# Patient Record
Sex: Female | Born: 1990 | Race: White | Hispanic: No | Marital: Single | Smoking: Never smoker
Health system: Southern US, Community
[De-identification: ages and names within clinical notes are randomized; demographics above are authoritative.]

## PROBLEM LIST (undated history)

## (undated) ENCOUNTER — Inpatient Hospital Stay (HOSPITAL_COMMUNITY): Payer: Self-pay

## (undated) DIAGNOSIS — J45909 Unspecified asthma, uncomplicated: Secondary | ICD-10-CM

## (undated) DIAGNOSIS — J309 Allergic rhinitis, unspecified: Secondary | ICD-10-CM

## (undated) HISTORY — DX: Unspecified asthma, uncomplicated: J45.909

## (undated) HISTORY — DX: Allergic rhinitis, unspecified: J30.9

---
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
## 2010-05-23 ENCOUNTER — Inpatient Hospital Stay (HOSPITAL_COMMUNITY)
Admission: RE | Admit: 2010-05-23 | Discharge: 2010-05-25 | Payer: Self-pay | Source: Home / Self Care | Admitting: Psychiatry

## 2010-05-23 ENCOUNTER — Ambulatory Visit: Payer: Self-pay | Admitting: Psychiatry

## 2010-10-03 LAB — COMPREHENSIVE METABOLIC PANEL
ALT: 25 U/L (ref 0–35)
AST: 26 U/L (ref 0–37)
Albumin: 4.2 g/dL (ref 3.5–5.2)
Alkaline Phosphatase: 77 U/L (ref 39–117)
BUN: 10 mg/dL (ref 6–23)
CO2: 25 mEq/L (ref 19–32)
Calcium: 9.7 mg/dL (ref 8.4–10.5)
Chloride: 101 mEq/L (ref 96–112)
Creatinine, Ser: 0.76 mg/dL (ref 0.4–1.2)
GFR calc Af Amer: >60 mL/min (ref >60)
GFR calc non Af Amer: >60 mL/min (ref >60)
Glucose, Bld: 102 mg/dL — ABNORMAL HIGH (ref 70–99)
Potassium: 4 mEq/L (ref 3.5–5.1)
Sodium: 137 mEq/L (ref 135–145)
Total Bilirubin: 0.5 mg/dL (ref 0.3–1.2)
Total Protein: 7.3 g/dL (ref 6.0–8.3)

## 2010-10-03 LAB — CBC
HCT: 39.1 % (ref 36.0–46.0)
Hemoglobin: 13.3 g/dL (ref 12.0–15.0)
MCH: 30.5 pg (ref 26.0–34.0)
MCHC: 33.9 g/dL (ref 30.0–36.0)
MCV: 90.1 fL (ref 78.0–100.0)
Platelets: 284 10*3/uL (ref 150–400)
RBC: 4.34 MIL/uL (ref 3.87–5.11)
RDW: 13.1 % (ref 11.5–15.5)
WBC: 6.3 10*3/uL (ref 4.0–10.5)

## 2010-10-03 LAB — TSH: TSH: 2.44 u[IU]/mL (ref 0.350–4.500)

## 2013-11-18 ENCOUNTER — Ambulatory Visit: Payer: Self-pay | Admitting: Cardiovascular Disease

## 2013-11-18 ENCOUNTER — Encounter: Payer: Self-pay | Admitting: *Deleted

## 2013-11-18 ENCOUNTER — Encounter: Payer: Self-pay | Admitting: Cardiovascular Disease

## 2013-11-24 ENCOUNTER — Encounter: Payer: Self-pay | Admitting: Cardiovascular Disease

## 2014-09-01 ENCOUNTER — Ambulatory Visit (INDEPENDENT_AMBULATORY_CARE_PROVIDER_SITE_OTHER): Payer: 59 | Admitting: Physician Assistant

## 2014-09-01 VITALS — BP 98/60 | HR 73 | Temp 98.6°F | Resp 16 | Ht 66.5 in | Wt 146.8 lb

## 2014-09-01 DIAGNOSIS — M5442 Lumbago with sciatica, left side: Secondary | ICD-10-CM

## 2014-09-01 DIAGNOSIS — M5432 Sciatica, left side: Secondary | ICD-10-CM | POA: Insufficient documentation

## 2014-09-01 NOTE — Patient Instructions (Signed)
[facility].  Please feel free to see us again for any other issues you may have.

## 2014-09-01 NOTE — Progress Notes (Signed)
   Subjective:    Patient ID: Melody Rivera, female    DOB: 07/14/1991, 24 y.o.   MRN: [id number]  Chief Complaint  Patient presents with  . Leg Pain  . Back Pain   Patient Active Problem List   Diagnosis Date Noted  . Sciatica of left side 09/01/2014   Medications, allergies, past medical history, surgical history, family history, social history and problem list reviewed and updated.  HPI  [age] yof with pmh low back pain, sciatica presents for ortho referral.   Has had left sided low back pain chronically for one year. No particular injury. 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   Three days ago she had an episode where she suddenly couldn't move her legs when walking to the bathroom. The feeling eventually came back to right leg but has continued to have lle weakness, tingling, and numbness. She went to a diff UC 3 days ago and they gave her pain meds and referred her to ortho.   She saw ortho earlier today and they want to order an mri. She needs referral from her pcp to the ortho group for insurance purposes. Dr Patsy Lageropland will be her new pcp. She is here today for the referral.   Review of Systems No bowel/bladder dysfx. No perianal los.     Objective:   Physical Exam  Constitutional: She is oriented to person, place, and time. She appears well-developed and well-nourished.  Non-toxic appearance. She does not have a sickly appearance. She does not appear ill. No distress.  BP 98/60 mmHg  Pulse 73  Temp(Src) 98.6 F (37 C) (Oral)  Resp 16  Ht 5' 6.5" (1.689 m)  Wt 146 lb 12.8 oz (66.588 kg)  BMI 23.34 kg/m2  SpO2 97%  LMP 08/28/2014   Neurological: She is alert and oriented to person, place, and time.  Psychiatric: She has a normal mood and affect. Her speech is normal and behavior is normal.      Assessment & Plan:   89 yof with pmh low back pain, sciatica presents for ortho referral.    Sciatica, left - Plan: Ambulatory referral to Orthopedic Surgery Left-sided low back pain with left-sided sciatica - Plan: Ambulatory referral to Orthopedic Surgery --no testing done in clinic today as pt just left ortho appt for same condition and had workup there --referral to ortho sent urgent so they can schedule her for mri --rtc if she needs anything in intermediate  Donnajean Lopesodd M. Nial Hawe, PA-C Physician Assistant-Certified [facility]  09/01/2014 7:03 PM

## 2015-07-04 ENCOUNTER — Encounter: Payer: Self-pay | Admitting: Family Medicine

## 2015-07-04 ENCOUNTER — Ambulatory Visit (INDEPENDENT_AMBULATORY_CARE_PROVIDER_SITE_OTHER): Payer: 59

## 2015-07-04 ENCOUNTER — Ambulatory Visit (INDEPENDENT_AMBULATORY_CARE_PROVIDER_SITE_OTHER): Payer: 59 | Admitting: Family Medicine

## 2015-07-04 VITALS — BP 118/70 | HR 63 | Temp 98.3°F | Resp 16 | Ht 66.0 in | Wt 162.0 lb

## 2015-07-04 DIAGNOSIS — R002 Palpitations: Secondary | ICD-10-CM | POA: Diagnosis not present

## 2015-07-04 LAB — COMPREHENSIVE METABOLIC PANEL
ALT: 33 U/L — ABNORMAL HIGH (ref 6–29)
AST: 27 U/L (ref 10–30)
Albumin: 4.5 g/dL (ref 3.6–5.1)
Alkaline Phosphatase: 38 U/L (ref 33–115)
BUN: 12 mg/dL (ref 7–25)
CALCIUM: 9.7 mg/dL (ref 8.6–10.2)
CO2: 25 mmol/L (ref 20–31)
Chloride: 103 mmol/L (ref 98–110)
Creat: 0.63 mg/dL (ref 0.50–1.10)
Glucose, Bld: 84 mg/dL (ref 65–99)
Potassium: 4 mmol/L (ref 3.5–5.3)
Sodium: 139 mmol/L (ref 135–146)
Total Bilirubin: 0.4 mg/dL (ref 0.2–1.2)
Total Protein: 7.2 g/dL (ref 6.1–8.1)

## 2015-07-04 LAB — CBC
HEMATOCRIT: 39 % (ref 36.0–46.0)
Hemoglobin: 13.2 g/dL (ref 12.0–15.0)
MCH: 30.6 pg (ref 26.0–34.0)
MCHC: 33.8 g/dL (ref 30.0–36.0)
MCV: 90.3 fL (ref 78.0–100.0)
MPV: 9.6 fL (ref 8.6–12.4)
Platelets: 282 10*3/uL (ref 150–400)
RBC: 4.32 MIL/uL (ref 3.87–5.11)
RDW: 11.9 % (ref 11.5–15.5)
WBC: 4.9 10*3/uL (ref 4.0–10.5)

## 2015-07-04 LAB — TSH: TSH: 2.156 u[IU]/mL (ref 0.350–4.500)

## 2015-07-04 NOTE — Progress Notes (Signed)
[facility] [address], [place] [id number] [phone]  Date:  07/04/2015   Name:  Melody Rivera   DOB:  [birth year]   MRN:  [id number]  PCP:  No PCP Per Patient    Chief Complaint: Shortness of Breath   History of Present Illness:  Melody Rivera is a 24 y.o. very pleasant female patient who presents with the following:  Established pt with history of asthma and allergies- here today with feelings of chest tightness, heart palp over the last few days.   She has noted sx for about 5 days.   She did have asthma but this has not bothered her for a long time- years.   Today is Monday- Thursday while at rest she felt that her chest was constricted, "couldn't breathe, tried to get some air by yawning.  My face felt very hot. Friday I had some chest congestion and I wanted to cough." then Saturday and Sunday she noted heart palpitations.   Her breathing is now back to normal.  She has palpitations that can last 3-4 minutes- not painful, no CP.  SOB is now gone She has had occasional palpitations in the past but never this persistent.   No history of syncope or CV concerns while exercising.   No decongestants of sudafed used, she has not used an inhaler (in "decades").  She has not increased her caffeine intake- no energy supplements or diet pills.    No immediate family members with heart issues- her GGF did die from his heart in his 4150s.   Never a smoker LMP was last week   Patient Active Problem List   Diagnosis Date Noted  . Sciatica of left side 09/01/2014    Past Medical History  Diagnosis Date  . ASTHMA 10/28/2006    Qualifier: Diagnosis of  By: Cato MulliganSwords MD, Bruce    . ALLERGIC RHINITIS 10/28/2006    Qualifier: Diagnosis of  By: Cato MulliganSwords MD, Bruce      History reviewed. No pertinent past surgical history.  Social History  Substance Use Topics  . Smoking status: Never Smoker   . Smokeless tobacco: None  . Alcohol Use: 0.6 - 1.8 oz/week    1-3  Standard drinks or equivalent per week    Family History  Problem Relation Age of Onset  . Cancer Paternal Grandmother     Allergies  Allergen Reactions  . Azithromycin Swelling and Rash    Swelling of tongue, rash on legs    Medication list has been reviewed and updated.  Current Outpatient Prescriptions on File Prior to Visit  Medication Sig Dispense Refill  . HYDROcodone-acetaminophen (NORCO) 10-325 MG per tablet Take 1 tablet by mouth every 6 (six) hours as needed.     No current facility-administered medications on file prior to visit.    Review of Systems:  As per HPI- otherwise negative.   Physical Examination: Filed Vitals:   07/04/15 0936  BP: 118/70  Pulse: 63  Temp: 98.3 F (36.8 C)  Resp: 16   Filed Vitals:   07/04/15 0936  Height: 5\' 6"  (1.676 m)  Weight: 162 lb (73.483 kg)   Body mass index is 26.16 kg/(m^2). Ideal Body Weight: Weight in (lb) to have BMI = 25: 154.6  GEN: WDWN, NAD, Non-toxic, A & O x 3, looks well HEENT: Atraumatic, Normocephalic. Neck supple. No masses, No LAD.  Bilateral TM wnl, oropharynx normal.  PEERL,EOMI.   Ears and Nose: No external deformity. CV: RRR,  No M/G/R. No JVD. No thrill. No extra heart sounds. PULM: CTA B, no wheezes, crackles, rhonchi. No retractions. No resp. distress. No accessory muscle use. EXTR: No c/c/e NEURO Normal gait.  PSYCH: Normally interactive. Conversant. Not depressed or anxious appearing.  Calm demeanor.   EKG: occasional PACs noted on her EKG and rhythm strip.   [facility] reading (PRIMARY) by  Dr. Patsy Lager. CXR: negative  CHEST 2 VIEW  COMPARISON: 05/31/2004  FINDINGS: The heart size and mediastinal contours are within normal limits. Both lungs are clear. The visualized skeletal structures are unremarkable.  IMPRESSION: No active cardiopulmonary disease.  Assessment and Plan: Palpitations - Plan: EKG 12-Lead, CBC, Comprehensive metabolic panel, TSH, DG Chest 2 View  Here today to  discuss SOB and heart palpitations- the SOB has been resolved for a couple of days, the heart palpitations occurred over the last 48 hours but are not present now.  No CP.  Suspect that she is having PACs/ PVCs causing the heart palpitations. Her EKG and CXR are reassuring, await labs.  She will let me know if sx come back or persist in which case I will refer her to cardiology. She feels comfortable with this plan.   Signed Abbe Amsterdam, MD

## 2015-07-04 NOTE — Patient Instructions (Signed)
Your EKG showed occasional PACs but nothing of acute concern. If you continue to have the palpitations and would like to see cardiology just let me know.  Otherwise I will be in touch with the rest of your labs asap

## 2017-04-28 IMAGING — CR DG CHEST 2V
2 series · 2 of 2 positions shown · non-contrast
Comparison: 05/31/2004

CLINICAL DATA: Shortness of breath. Central chest pressure for 3
days.

EXAM:
CHEST  2 VIEW

[PA]
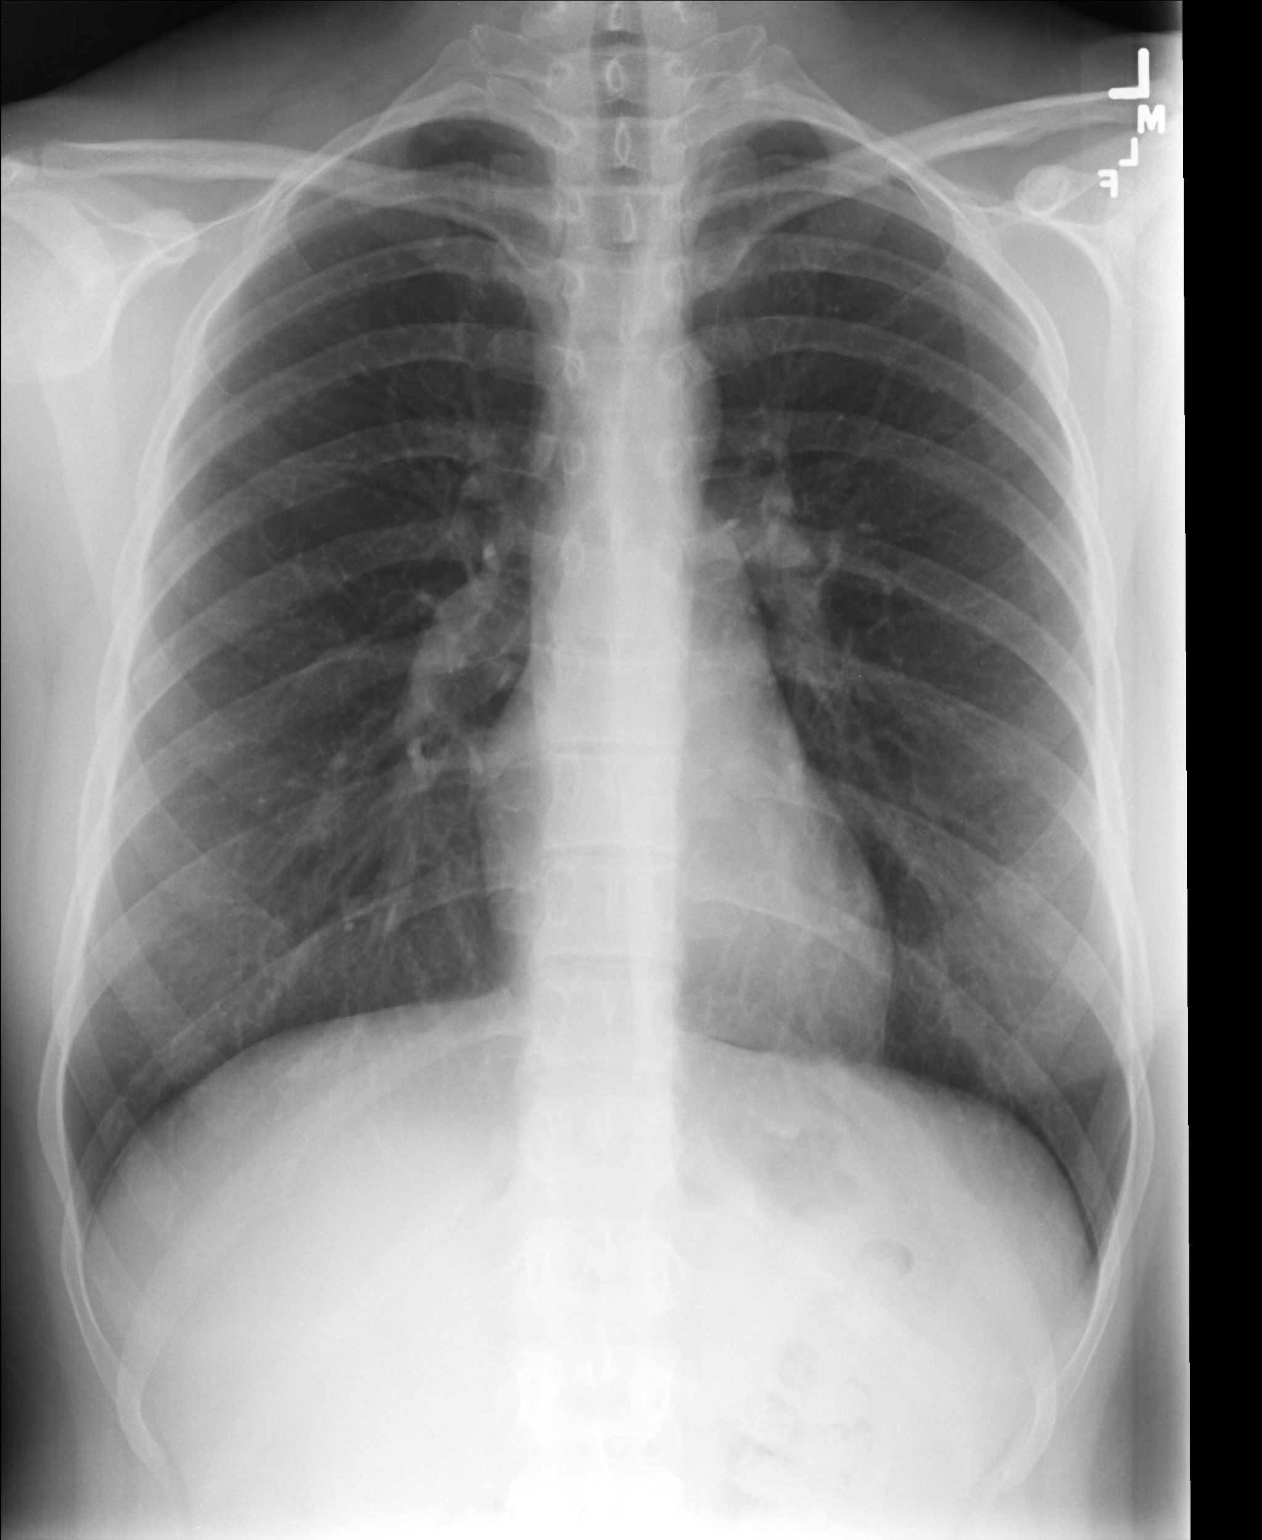

[lateral]
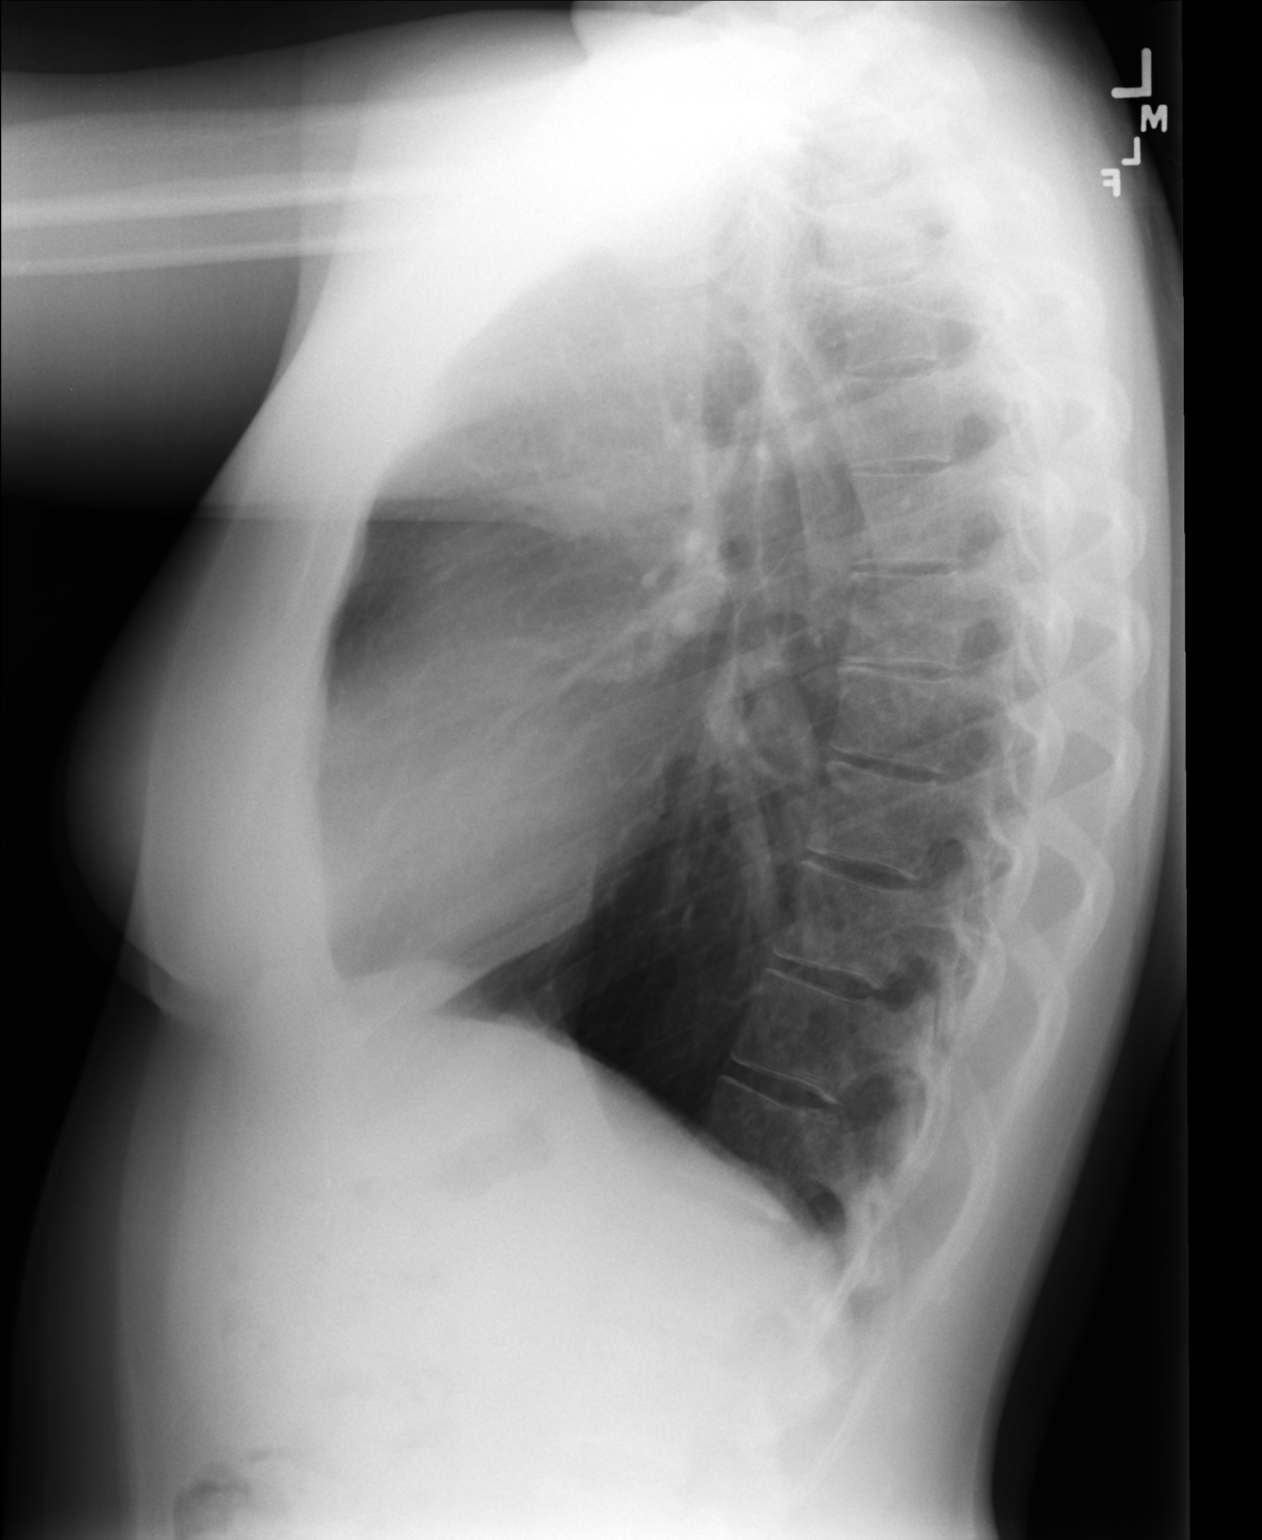

[2 of 2 positions shown; findings below may reference images not displayed]

FINDINGS: The heart size and mediastinal contours are within normal limits.
Both lungs are clear. The visualized skeletal structures are
unremarkable.
IMPRESSION: No active cardiopulmonary disease.

## 2019-06-17 LAB — NOVEL CORONAVIRUS, NAA: SARS-CoV-2, NAA: NOT DETECTED

## 2019-06-19 ENCOUNTER — Other Ambulatory Visit: Payer: Self-pay

## 2019-06-19 DIAGNOSIS — Z20822 Contact with and (suspected) exposure to covid-19: Secondary | ICD-10-CM

## 2019-09-14 ENCOUNTER — Ambulatory Visit: Payer: Self-pay | Attending: Family

## 2019-09-14 DIAGNOSIS — Z23 Encounter for immunization: Secondary | ICD-10-CM | POA: Insufficient documentation

## 2019-09-14 NOTE — Progress Notes (Signed)
   Covid-19 Vaccination Clinic  Name:  Melody Rivera    MRN: [id number] DOB: 06/26/1991  09/14/2019  [name] was observed post Covid-19 immunization for 15 minutes without incidence. She was provided with Vaccine Information Sheet and instruction to access the V-Safe system.   [name] was instructed to call 911 with any severe reactions post vaccine: Marland Kitchen Difficulty breathing  . Swelling of your face and throat  . A fast heartbeat  . A bad rash all over your body  . Dizziness and weakness    Immunizations Administered    Name Date Dose VIS Date Route   Moderna COVID-19 Vaccine 09/14/2019 12:51 PM 0.5 mL 06/23/2019 Intramuscular   Manufacturer: Moderna   Lot: [id number]   NDC: 08676-195-09

## 2019-10-20 ENCOUNTER — Ambulatory Visit: Payer: Self-pay | Attending: Internal Medicine

## 2019-10-20 DIAGNOSIS — Z23 Encounter for immunization: Secondary | ICD-10-CM

## 2019-10-20 NOTE — Progress Notes (Signed)
   Covid-19 Vaccination Clinic  Name:  Melody Rivera    MRN: [id number] DOB: Dec 30, 1990  10/20/2019  Melody Rivera was observed post Covid-19 immunization for 15 minutes without incident. She was provided with Vaccine Information Sheet and instruction to access the V-Safe system.   Melody Rivera was instructed to call 911 with any severe reactions post vaccine: Marland Kitchen Difficulty breathing  . Swelling of face and throat  . A fast heartbeat  . A bad rash all over body  . Dizziness and weakness   Immunizations Administered    Name Date Dose VIS Date Route   Moderna COVID-19 Vaccine 10/20/2019 12:04 PM 0.5 mL 06/23/2019 Intramuscular   Manufacturer: Moderna   Lot: [id number]   NDC: 38182-993-71

## 2021-07-21 ENCOUNTER — Other Ambulatory Visit: Payer: Self-pay

## 2021-07-21 ENCOUNTER — Emergency Department (HOSPITAL_BASED_OUTPATIENT_CLINIC_OR_DEPARTMENT_OTHER): Payer: 59 | Admitting: Radiology

## 2021-07-21 ENCOUNTER — Encounter (HOSPITAL_BASED_OUTPATIENT_CLINIC_OR_DEPARTMENT_OTHER): Payer: Self-pay | Admitting: Emergency Medicine

## 2021-07-21 ENCOUNTER — Emergency Department (HOSPITAL_BASED_OUTPATIENT_CLINIC_OR_DEPARTMENT_OTHER)
Admission: EM | Admit: 2021-07-21 | Discharge: 2021-07-21 | Disposition: A | Discharge status: Home / Self Care | Payer: 59 | Attending: Emergency Medicine | Admitting: Emergency Medicine

## 2021-07-21 DIAGNOSIS — R0789 Other chest pain: Secondary | ICD-10-CM | POA: Insufficient documentation

## 2021-07-21 DIAGNOSIS — Z20822 Contact with and (suspected) exposure to covid-19: Secondary | ICD-10-CM | POA: Diagnosis not present

## 2021-07-21 DIAGNOSIS — R059 Cough, unspecified: Secondary | ICD-10-CM | POA: Diagnosis not present

## 2021-07-21 DIAGNOSIS — R21 Rash and other nonspecific skin eruption: Secondary | ICD-10-CM | POA: Diagnosis not present

## 2021-07-21 DIAGNOSIS — R0981 Nasal congestion: Secondary | ICD-10-CM | POA: Insufficient documentation

## 2021-07-21 DIAGNOSIS — J45909 Unspecified asthma, uncomplicated: Secondary | ICD-10-CM | POA: Diagnosis not present

## 2021-07-21 LAB — CBC WITH DIFFERENTIAL/PLATELET
Abs Immature Granulocytes: 0.02 10*3/uL (ref 0.00–0.07)
Basophils Absolute: 0 10*3/uL (ref 0.0–0.1)
Basophils Relative: 1 %
Eosinophils Absolute: 0.1 10*3/uL (ref 0.0–0.5)
Eosinophils Relative: 2 %
HCT: 39.1 % (ref 36.0–46.0)
Hemoglobin: 13 g/dL (ref 12.0–15.0)
Immature Granulocytes: 0 %
Lymphocytes Relative: 35 %
Lymphs Abs: 1.9 10*3/uL (ref 0.7–4.0)
MCH: 30.9 pg (ref 26.0–34.0)
MCHC: 33.2 g/dL (ref 30.0–36.0)
MCV: 92.9 fL (ref 80.0–100.0)
Monocytes Absolute: 0.4 10*3/uL (ref 0.1–1.0)
Monocytes Relative: 7 %
Neutro Abs: 3 10*3/uL (ref 1.7–7.7)
Neutrophils Relative %: 55 %
Platelets: 266 10*3/uL (ref 150–400)
RBC: 4.21 MIL/uL (ref 3.87–5.11)
RDW: 11.8 % (ref 11.5–15.5)
WBC: 5.4 10*3/uL (ref 4.0–10.5)
nRBC: 0 % (ref 0.0–0.2)

## 2021-07-21 LAB — RESP PANEL BY RT-PCR (FLU A&B, COVID) ARPGX2
Influenza A by PCR: NEGATIVE
Influenza B by PCR: NEGATIVE
SARS Coronavirus 2 by RT PCR: NEGATIVE

## 2021-07-21 LAB — BASIC METABOLIC PANEL
Anion gap: 9 (ref 5–15)
BUN: 8 mg/dL (ref 6–20)
CO2: 25 mmol/L (ref 22–32)
Calcium: 9 mg/dL (ref 8.9–10.3)
Chloride: 106 mmol/L (ref 98–111)
Creatinine, Ser: 0.62 mg/dL (ref 0.44–1.00)
GFR, Estimated: >60 mL/min (ref >60)
Glucose, Bld: 89 mg/dL (ref 70–99)
Potassium: 3.6 mmol/L (ref 3.5–5.1)
Sodium: 140 mmol/L (ref 135–145)

## 2021-07-21 LAB — TROPONIN I (HIGH SENSITIVITY): Troponin I (High Sensitivity): <2 ng/L (ref <18)

## 2021-07-21 MED ORDER — ALBUTEROL SULFATE HFA 108 (90 BASE) MCG/ACT IN AERS
2.0000 | INHALATION_SPRAY | Freq: Once | RESPIRATORY_TRACT | Status: AC
  Administered 2021-07-21: 15:00:00 2 via RESPIRATORY_TRACT
  Filled 2021-07-21: qty 6.7

## 2021-07-21 MED ORDER — ALBUTEROL SULFATE HFA 108 (90 BASE) MCG/ACT IN AERS: 2.0000 | INHALATION_SPRAY | RESPIRATORY_TRACT | 2 refills | Status: AC | PRN

## 2021-07-21 NOTE — ED Triage Notes (Signed)
Cough with SOB on exertion since waking up this am.

## 2021-07-21 NOTE — ED Provider Notes (Signed)
[facility] EMERGENCY DEPT Provider Note   CSN: [id number] Arrival date & time: 07/21/21  1011     History Chief Complaint  Patient presents with   Shortness of Breath    Melody Rivera is a 30 y.o. female presenting to emergency department chest tightness, cough, congestion.  Patient ports onset of symptoms yesterday evening into this morning.  She reports that she had a cough beginning yesterday, noted an itchy rash in her bilateral hands.  She says she has been coughing into the morning, woke up with a feeling of tightness in a band pattern across the middle of her chest.  She has never had this feeling before.  She denies fevers or chills.  She denies sick contacts in the house.  She reports a distant history of asthma.  She denies any other significant medical problems.    No hemoptysis or asymmetric LE edema. Patient denies personal history of DVT or PE. No recent hormone use (including OCP); travel for >6 hours; prolonged immobilization for greater than 3 days; surgeries or trauma in the last 4 weeks; or malignancy with treatment within 6 months.  She does not smoke or use illicit drug   HPI     Past Medical History:  Diagnosis Date   ALLERGIC RHINITIS 10/28/2006   Qualifier: Diagnosis of  By: Cato Mulligan MD, Bruce     ASTHMA 10/28/2006   Qualifier: Diagnosis of  By: Cato Mulligan MD, Bruce      Patient Active Problem List   Diagnosis Date Noted   Sciatica of left side 09/01/2014    History reviewed. No pertinent surgical history.   OB History   No obstetric history on file.     Family History  Problem Relation Age of Onset   Cancer Paternal Grandmother     Social History   Tobacco Use   Smoking status: Never  Substance Use Topics   Alcohol use: Yes    Alcohol/week: 1.0 - 3.0 standard drink    Types: 1 - 3 Standard drinks or equivalent per week   Drug use: No    Home Medications Prior to Admission medications   Medication Sig Start Date End Date  Taking? Authorizing Provider  albuterol (VENTOLIN HFA) 108 (90 Base) MCG/ACT inhaler Inhale 2 puffs into the lungs every 4 (four) hours as needed for wheezing or shortness of breath. 07/21/21  Yes Terald Sleeper, MD    Allergies    Azithromycin  Review of Systems   Review of Systems  Constitutional:  Negative for chills and fever.  Eyes:  Negative for photophobia and visual disturbance.  Respiratory:  Positive for cough, chest tightness and shortness of breath.   Cardiovascular:  Positive for chest pain. Negative for palpitations.  Gastrointestinal:  Negative for abdominal pain and vomiting.  Genitourinary:  Negative for dysuria and hematuria.  Musculoskeletal:  Negative for arthralgias and back pain.  Skin:  Negative for color change and rash.  Neurological:  Negative for syncope and headaches.  All other systems reviewed and are negative.  Physical Exam Updated Vital Signs BP (!) 129/95    Pulse 85    Temp 99.4 F (37.4 C) (Oral)    Resp 13    Ht 5\' 6"  (1.676 m)    Wt 72.6 kg    LMP 06/27/2021    SpO2 100%    BMI 25.82 kg/m   Physical Exam Constitutional:      General: She is not in acute distress. HENT:     Head:  Normocephalic and atraumatic.  Eyes:     Conjunctiva/sclera: Conjunctivae normal.     Pupils: Pupils are equal, round, and reactive to light.  Cardiovascular:     Rate and Rhythm: Normal rate and regular rhythm.  Pulmonary:     Effort: Pulmonary effort is normal. No respiratory distress.  Abdominal:     General: There is no distension.     Tenderness: There is no abdominal tenderness.  Skin:    General: Skin is warm and dry.  Neurological:     General: No focal deficit present.     Mental Status: She is alert. Mental status is at baseline.  Psychiatric:        Mood and Affect: Mood normal.        Behavior: Behavior normal.    ED Results / Procedures / Treatments   Labs (all labs ordered are listed, but only abnormal results are displayed) Labs  Reviewed  RESP PANEL BY RT-PCR (FLU A&B, COVID) [id number]  BASIC METABOLIC PANEL  CBC WITH DIFFERENTIAL/PLATELET  TROPONIN I (HIGH SENSITIVITY)    EKG EKG Interpretation  Date/Time:  Friday July 21 2021 10:29:21 EST Ventricular Rate:  81 PR Interval:  144 QRS Duration: 98 QT Interval:  366 QTC Calculation: 425 R Axis:   86 Text Interpretation: Sinus rhythm with Premature atrial complexes Otherwise normal ECG No previous ECGs available Confirmed by Octaviano Glow [phone]) on 07/21/2021 2:45:58 PM  Radiology DG Chest 2 View  Result Date: 07/21/2021 CLINICAL DATA:  Shortness of breath. EXAM: CHEST - 2 VIEW COMPARISON:  July 04, 2015. FINDINGS: The heart size and mediastinal contours are within normal limits. Both lungs are clear. The visualized skeletal structures are unremarkable. IMPRESSION: No active cardiopulmonary disease. Electronically Signed   By: Marijo Conception M.D.   On: 07/21/2021 11:00    Procedures Procedures   Medications Ordered in ED Medications  albuterol (VENTOLIN HFA) 108 (90 Base) MCG/ACT inhaler 2 puff (2 puffs Inhalation Given 07/21/21 1501)    ED Course  I have reviewed the triage vital signs and the nursing notes.  Pertinent labs & imaging results that were available during my care of the patient were reviewed by me and considered in my medical decision making (see chart for details).  This patient presents to the Emergency Department with complaint of chest pain. This involves an extensive number of treatment options, and is a complaint that carries with it a high risk of complications and morbidity.  The differential diagnosis includes ACS vs Pneumothorax vs Reflux/Gastritis vs MSK pain vs Pneumonia vs other.  This is most consistent however with a viral syndrome given her constellation of symptoms.  I felt PE was less likely given that she is PERC negative  I ordered, reviewed, and interpreted labs, including BMP and CBC.  There were no  immediate, life-threatening emergencies found in this labwork.  The patient's troponin level was less than 2, undetectable. I ordered medication albuterol for possible bronchospasm I ordered imaging studies which included x-ray of the chest I independently visualized and interpreted imaging which showed no focal infiltrate or evidence of pneumothorax and the monitor tracing which showed normal sinus rhythm I personally reviewed the patients ECG which showed sinus rhythm with no acute ischemic findings  After the interventions stated above, I reevaluated the patient and found that they remained clinically stable.  Based on the patient's clinical exam, vital signs, risk factors, and ED testing, I felt that the patient's overall risk of life-threatening emergency such as  ACS, PE, sepsis, or infection was low.  At this time, I felt the patient's presentation was most clinically consistent with bronchospasm related to a viral URI, but explained to the patient that this evaluation was not a definitive diagnostic workup.  I discussed outpatient follow up with primary care provider, and provided specialist office number on the patient's discharge paper if a referral was deemed necessary.  Return precautions were discussed with the patient.  I felt the patient was clinically stable for discharge.   Clinical Course as of 07/21/21 1530  Fri Jul 21, 2021  1529 Patient feels much better after albuterol puffs with her chest tightness.  I will prescribe her albuterol for home.  Suspect this may be some mild reactive airway disease. [MT]    Clinical Course User Index [MT] Jozelyn Kuwahara, Carola Rhine, MD    Final Clinical Impression(s) / ED Diagnoses Final diagnoses:  Chest tightness    Rx / DC Orders ED Discharge Orders          Ordered    albuterol (VENTOLIN HFA) 108 (90 Base) MCG/ACT inhaler  Every 4 hours PRN        07/21/21 1529             Wyvonnia Dusky, MD 07/21/21 1530

## 2023-01-08 ENCOUNTER — Encounter: Payer: Self-pay | Admitting: Internal Medicine

## 2023-01-08 ENCOUNTER — Ambulatory Visit: Payer: 59 | Attending: Internal Medicine | Admitting: Internal Medicine

## 2023-01-08 ENCOUNTER — Ambulatory Visit: Payer: 59 | Attending: Internal Medicine

## 2023-01-08 VITALS — BP 124/86 | HR 83 | Ht 66.0 in | Wt 188.4 lb

## 2023-01-08 DIAGNOSIS — I491 Atrial premature depolarization: Secondary | ICD-10-CM

## 2023-01-08 DIAGNOSIS — R002 Palpitations: Secondary | ICD-10-CM | POA: Diagnosis not present

## 2023-01-08 NOTE — Progress Notes (Signed)
Cardiology Office Note:    Date:  01/08/2023   ID:  Melody Rivera, DOB [birth year], MRN [id number]  PCP:  Shon Hale, MD   [facility] Providers Cardiologist:  None     Referring MD: Shon Hale, *   No chief complaint on file. Palpitations  History of Present Illness:    Melody Rivera is a 32 y.o. female with a hx of asthma, referral for palpitations.  She had an EKG in [place] that showed PACs. TSH 2.3.  She drinks coffee.  No syncope  Past Medical History:  Diagnosis Date   ALLERGIC RHINITIS 10/28/2006   Qualifier: Diagnosis of  By: Cato Mulligan MD, Bruce     ASTHMA 10/28/2006   Qualifier: Diagnosis of  By: Cato Mulligan MD, Bruce      No past surgical history on file.  Current Medications: Current Outpatient Medications on File Prior to Visit  Medication Sig Dispense Refill   albuterol (VENTOLIN HFA) 108 (90 Base) MCG/ACT inhaler Inhale 2 puffs into the lungs every 4 (four) hours as needed for wheezing or shortness of breath. 8 g 2   amoxicillin (AMOXIL) 875 MG tablet Take 875 mg by mouth 2 (two) times daily.     No current facility-administered medications on file prior to visit.    Allergies:   Azithromycin   Social History   Socioeconomic History   Marital status: Single    Spouse name: Not on file   Number of children: Not on file   Years of education: Not on file   Highest education level: Not on file  Occupational History   Not on file  Tobacco Use   Smoking status: Never   Smokeless tobacco: Not on file  Substance and Sexual Activity   Alcohol use: Yes    Alcohol/week: 1.0 - 3.0 standard drink of alcohol    Types: 1 - 3 Standard drinks or equivalent per week   Drug use: No   Sexual activity: Not on file  Other Topics Concern   Not on file  Social History Narrative   Not on file   Social Determinants of Health   Financial Resource Strain: Not on file  Food Insecurity: Not on file  Transportation Needs: Not on file  Physical  Activity: Not on file  Stress: Not on file  Social Connections: Not on file     Family History: The patient's family history includes Cancer in her paternal grandmother. Mother has afib.   ROS:   Please see the history of present illness.     All other systems reviewed and are negative.  EKGs/Labs/Other Studies Reviewed:    The following studies were reviewed today:      Recent Labs: No results found for requested labs within last 365 days.   Recent Lipid Panel No results found for: "CHOL", "TRIG", "HDL", "CHOLHDL", "VLDL", "LDLCALC", "LDLDIRECT"   Risk Assessment/Calculations:     Physical Exam:    VS:   Vitals:   01/08/23 1531  BP: 124/86  Pulse: 83  SpO2: 98%      Wt Readings from Last 3 Encounters:  07/21/21 160 lb (72.6 kg)  07/04/15 162 lb (73.5 kg)  09/01/14 146 lb 12.8 oz (66.6 kg)     GEN:  Well nourished, well developed in no acute distress HEENT: Normal NECK: No JVD; No carotid bruits CARDIAC: RRR, no murmurs, rubs, gallops RESPIRATORY:  Clear to auscultation without rales, wheezing or rhonchi  ABDOMEN: Soft, non-tender, non-distended MUSCULOSKELETAL:  No edema; No  deformity  SKIN: Warm and dry NEUROLOGIC:  Alert and oriented x 3 PSYCHIATRIC:  Normal affect   ASSESSMENT:    PACs PACs, are benign, she has no signs of a concerning arrhythmia and she I slow risk for coronary dx. Will obtain a cardiac monitor to assess her burden   PLAN:    In order of problems listed above:  3 day ziopatch Follow PRN           Medication Adjustments/Labs and Tests Ordered: Current medicines are reviewed at length with the patient today.  Concerns regarding medicines are outlined above.  No orders of the defined types were placed in this encounter.  No orders of the defined types were placed in this encounter.   There are no Patient Instructions on file for this visit.   Signed, Maisie Fus, MD  01/08/2023 3:30 PM    [facility]

## 2023-01-08 NOTE — Progress Notes (Unsigned)
Enrolled for [employer] to mail a ZIO XT long term holter monitor to the patients address on file.  

## 2023-01-08 NOTE — Patient Instructions (Signed)
Medication Instructions:  Your physician recommends that you continue on your current medications as directed. Please refer to the Current Medication list given to you today.  *If you need a refill on your cardiac medications before your next appointment, please call your pharmacy*   Lab Work: NONE If you have labs (blood work) drawn today and your tests are completely normal, you will receive your results only by: MyChart Message (if you have MyChart) OR A paper copy in the mail If you have any lab test that is abnormal or we need to change your treatment, we will call you to review the results.   Testing/Procedures: ZIO XT- Long Term Monitor Instructions  WILL BE MAILED TO YOU WITHIN 3-7 DAYS.  Your physician has requested you wear a ZIO patch monitor for 14 days.  This is a single patch monitor. [employer] supplies one patch monitor per enrollment. Additional stickers are not available. Please do not apply patch if you will be having a Nuclear Stress Test,  Echocardiogram, Cardiac CT, MRI, or Chest Xray during the period you would be wearing the  monitor. The patch cannot be worn during these tests. You cannot remove and re-apply the  ZIO XT patch monitor.  Your ZIO patch monitor will be mailed 3 day USPS to your address on file. It may take 3-5 days  to receive your monitor after you have been enrolled.  Once you have received your monitor, please review the enclosed instructions. Your monitor  has already been registered assigning a specific monitor serial # to you.  Billing and Patient Assistance Program Information  We have supplied [employer] with any of your insurance information on file for billing purposes. [employer] offers a sliding scale Patient Assistance Program for patients that do not have  insurance, or whose insurance does not completely cover the cost of the ZIO monitor.  You must apply for the Patient Assistance Program to qualify for this discounted rate.  To apply,  please call [employer] at [phone], select option 4, select option 2, ask to apply for  Patient Assistance Program. Meredeth Ide will ask your household income, and how many people  are in your household. They will quote your out-of-pocket cost based on that information.  [employer] will also be able to set up a 64-month, interest-free payment plan if needed.  Applying the monitor   Shave hair from upper left chest.  Hold abrader disc by orange tab. Rub abrader in 40 strokes over the upper left chest as  indicated in your monitor instructions.  Clean area with 4 enclosed alcohol pads. Let dry.  Apply patch as indicated in monitor instructions. Patch will be placed under collarbone on left  side of chest with arrow pointing upward.  Rub patch adhesive wings for 2 minutes. Remove white label marked "1". Remove the white  label marked "2". Rub patch adhesive wings for 2 additional minutes.  While looking in a mirror, press and release button in center of patch. A small green light will  flash 3-4 times. This will be your only indicator that the monitor has been turned on.  Do not shower for the first 24 hours. You may shower after the first 24 hours.  Press the button if you feel a symptom. You will hear a small click. Record Date, Time and  Symptom in the Patient Logbook.  When you are ready to remove the patch, follow instructions on the last 2 pages of Patient  Logbook. Stick patch monitor onto the last  page of Patient Logbook.  Place Patient Logbook in the blue and white box. Use locking tab on box and tape box closed  securely. The blue and white box has prepaid postage on it. Please place it in the mailbox as  soon as possible. Your physician should have your test results approximately 7 days after the  monitor has been mailed back to [facility].  Call [facility] Customer Care at [phone] if you have questions regarding  your ZIO XT patch monitor. Call them immediately if you see  an orange light blinking on your  monitor.  If your monitor falls off in less than 4 days, contact our Monitor department at [phone].  If your monitor becomes loose or falls off after 4 days call [employer] at [phone] for  suggestions on securing your monitor    Follow-Up: At [facility], you and your health needs are our priority.  As part of our continuing mission to provide you with exceptional heart care, we have created designated Provider Care Teams.  These Care Teams include your primary Cardiologist (physician) and Advanced Practice Providers (APPs -  Physician Assistants and Nurse Practitioners) who all work together to provide you with the care you need, when you need it.  We recommend signing up for the patient portal called "MyChart".  Sign up information is provided on this After Visit Summary.  MyChart is used to connect with patients for Virtual Visits (Telemedicine).  Patients are able to view lab/test results, encounter notes, upcoming appointments, etc.  Non-urgent messages can be sent to your provider as well.   To learn more about what you can do with MyChart, go to [email or web address].    Your next appointment:   AS NEEDED  Provider:   Dr. Carolan Clines, MD

## 2023-01-27 DIAGNOSIS — I491 Atrial premature depolarization: Secondary | ICD-10-CM | POA: Diagnosis not present

## 2023-01-27 DIAGNOSIS — R002 Palpitations: Secondary | ICD-10-CM | POA: Diagnosis not present

## 2024-07-17 ENCOUNTER — Inpatient Hospital Stay (HOSPITAL_COMMUNITY)
Admission: AD | Admit: 2024-07-17 | Discharge: 2024-07-17 | Disposition: A | Discharge status: Home / Self Care | Attending: Obstetrics & Gynecology | Admitting: Obstetrics & Gynecology

## 2024-07-17 ENCOUNTER — Encounter (HOSPITAL_COMMUNITY): Payer: Self-pay | Admitting: Obstetrics & Gynecology

## 2024-07-17 ENCOUNTER — Inpatient Hospital Stay (HOSPITAL_COMMUNITY)

## 2024-07-17 DIAGNOSIS — B3731 Acute candidiasis of vulva and vagina: Secondary | ICD-10-CM

## 2024-07-17 DIAGNOSIS — O23592 Infection of other part of genital tract in pregnancy, second trimester: Secondary | ICD-10-CM | POA: Insufficient documentation

## 2024-07-17 DIAGNOSIS — Z3A27 27 weeks gestation of pregnancy: Secondary | ICD-10-CM | POA: Diagnosis not present

## 2024-07-17 DIAGNOSIS — O98812 Other maternal infectious and parasitic diseases complicating pregnancy, second trimester: Secondary | ICD-10-CM | POA: Diagnosis not present

## 2024-07-17 DIAGNOSIS — O26892 Other specified pregnancy related conditions, second trimester: Secondary | ICD-10-CM

## 2024-07-17 DIAGNOSIS — O4692 Antepartum hemorrhage, unspecified, second trimester: Secondary | ICD-10-CM

## 2024-07-17 LAB — WET PREP, GENITAL
Clue Cells Wet Prep HPF POC: NONE SEEN
Sperm: NONE SEEN
Trich, Wet Prep: NONE SEEN
WBC, Wet Prep HPF POC: >=10 — AB

## 2024-07-17 LAB — POCT PREGNANCY, URINE: Preg Test, Ur: POSITIVE — AB

## 2024-07-17 MED ORDER — MICONAZOLE NITRATE 2 % VA CREA: 1.0000 | TOPICAL_CREAM | Freq: Every day | VAGINAL | 2 refills | Status: AC

## 2024-07-17 NOTE — Discharge Instructions (Signed)
 You came into the hospital because you had an episode of vaginal spotting. We did swabs which showed a yeast infection. I sent a prescription for miconazole , as you mentioned terconazole stopped working for you. You will insert this medication into the vagina every night for 7 nights. This may not treat the infection since the terconazole hasn't been working. We also did an ultrasound and your baby and placenta look normal. Follow up with your OB as scheduled.

## 2024-07-17 NOTE — MAU Provider Note (Signed)
 Chief Complaint:  Abdominal Pain and Vaginal Bleeding   HPI   None     Melody Rivera is a 33 y.o. G1P0 at [redacted]w[redacted]d who presents to maternity admissions reporting vaginal spotting this afternoon. Noted bright red blood on tissue when wiping. Also describing some lower abdominal cramping. Last ultrasound done November 12th. Does has marginal cord insertion, plan for repeat ultrasound at 30 weeks. Denies contractions, leakage of fluid or decreased fetal movement. Does have vaginal itching, denies malodorous discharge or dysuria. No recent sexual intercourse.  Pregnancy Course: Follows at [facility] for prenatal care. Pregnancy is complicated by GERD and marginal cord insertion.   Past Medical History:  Diagnosis Date   ALLERGIC RHINITIS 10/28/2006   Qualifier: Diagnosis of  By: Tammie MD, Bruce     ASTHMA 10/28/2006   Qualifier: Diagnosis of  By: Tammie MD, Bruce     OB History  Gravida Para Term Preterm AB Living  1       SAB IAB Ectopic Multiple Live Births          # Outcome Date GA Lbr Len/2nd Weight Sex Type Anes PTL Lv  1 Current            No past surgical history on file. Family History  Problem Relation Age of Onset   Cancer Paternal Grandmother    Social History[1] Allergies[2] Medications Prior to Admission  Medication Sig Dispense Refill Last Dose/Taking   albuterol  (VENTOLIN  HFA) 108 (90 Base) MCG/ACT inhaler Inhale 2 puffs into the lungs every 4 (four) hours as needed for wheezing or shortness of breath. 8 g 2    amoxicillin (AMOXIL) 875 MG tablet Take 875 mg by mouth 2 (two) times daily.       I have reviewed patient's Past Medical Hx, Surgical Hx, Family Hx, Social Hx, medications and allergies.   ROS  Pertinent items noted in HPI and remainder of comprehensive ROS otherwise negative.   PHYSICAL EXAM  Patient Vitals for the past 24 hrs:  BP Temp Temp src Pulse Resp Height Weight  07/17/24 2301 -- -- -- -- 12 -- --  07/17/24 1944 121/66 98.7 F (37.1 C) Oral 84 12 5'  6 (1.676 m) 96.3 kg    Constitutional: Well-developed, well-nourished female in no acute distress.  Cardiovascular: Warm and well-perfused Respiratory: normal effort, no problems with respiration noted GI: Abd soft, non-tender, non-distended MS: Extremities nontender, no edema, normal ROM Neurologic: Alert and oriented x 4.  Pelvic: deferred     Labs: Results for orders placed or performed during the hospital encounter of 07/17/24 (from the past 24 hours)  Pregnancy, urine POC     Status: Abnormal   Collection Time: 07/17/24  6:40 PM  Result Value Ref Range   Preg Test, Ur POSITIVE (A) NEGATIVE  Wet prep, genital     Status: Abnormal   Collection Time: 07/17/24  9:59 PM  Result Value Ref Range   Yeast Wet Prep HPF POC PRESENT (A) NONE SEEN   Trich, Wet Prep NONE SEEN NONE SEEN   Clue Cells Wet Prep HPF POC NONE SEEN NONE SEEN   WBC, Wet Prep HPF POC >=10 (A) <10   Sperm NONE SEEN     Imaging:  No results found.  MDM & MAU COURSE  MDM: Moderate  MAU Course: Orders Placed This Encounter  Procedures   Wet prep, genital   US  MFM OB LIMITED   Pregnancy, urine POC   Discharge patient   Meds ordered this encounter  Medications   miconazole  (MONISTAT  7) 2 % vaginal cream    Sig: Place 1 Applicatorful vaginally at bedtime. Apply for seven nights    Dispense:  30 g    Refill:  2   VSS. Swabs collected. Will also send for ultrasound. 1045p - Results back, swab positive for yeast. Ultrasound showing no placental abruption. Discussed results with patient, would like to be treated for yeast. Sent miconazole  as she has tried terconazole in the past with mixed results. Counseled that she may not have success with miconazole . All questions answered prior to discharge.  ASSESSMENT   1. [redacted] weeks gestation of pregnancy   2. Vulvovaginitis due to yeast     PLAN  Discharge home in stable condition with return precautions.  Follow up with OB as scheduled.    Allergies as of  07/17/2024       Reactions   Azithromycin Swelling, Rash   Swelling of tongue, rash on legs        Medication List     STOP taking these medications    amoxicillin 875 MG tablet Commonly known as: AMOXIL       TAKE these medications    albuterol  108 (90 Base) MCG/ACT inhaler Commonly known as: VENTOLIN  HFA Inhale 2 puffs into the lungs every 4 (four) hours as needed for wheezing or shortness of breath.   miconazole  2 % vaginal cream Commonly known as: MONISTAT  7 Place 1 Applicatorful vaginally at bedtime. Apply for seven nights        Charlie Courts, MD  Family Medicine - Obstetrics Fellow        [1]  Social History Tobacco Use   Smoking status: Never  Substance Use Topics   Alcohol use: Yes    Alcohol/week: 1.0 - 3.0 standard drink of alcohol    Types: 1 - 3 Standard drinks or equivalent per week   Drug use: No  [2]  Allergies Allergen Reactions   Azithromycin Swelling and Rash    Swelling of tongue, rash on legs

## 2024-07-17 NOTE — MAU Note (Signed)
 Pt says she get [facility]- Dr Barbette Says at 3pm- started light amt of pink vag spotting on her underwear then when she wiped - bight red. No spotting since but feels  lower abd pressure - started at 330- 6/10. Now - pressure - 4/10 Has had some lower abd pain- none now . She called office - closed  Feels baby moving

## 2024-07-20 LAB — GC/CHLAMYDIA PROBE AMP (~~LOC~~) NOT AT ARMC
Chlamydia: NEGATIVE
Comment: NEGATIVE
Comment: NORMAL
Neisseria Gonorrhea: NEGATIVE

## 2024-08-10 NOTE — Progress Notes (Unsigned)
 Class start Time: ***   Class End Time: ***  This was a class of *** patients.   Patient was seen on *** for Gestational Diabetes self-management class at the [facility]. The following learning objectives were met by the patient during this course:  States the definition of Gestational Diabetes States why dietary management is important in controlling blood glucose Describes the effects each nutrient has on blood glucose levels Demonstrates ability to create a balanced meal plan Demonstrates carbohydrate counting  States when to check blood glucose levels Demonstrates proper blood glucose monitoring techniques States the effect of stress and exercise on blood glucose levels States the importance of limiting caffeine and abstaining from alcohol and smoking  Blood glucose monitor given: *** Lot # *** Exp: *** Blood glucose reading: ***  *** Patient has a meter prior to visit. Patient is *** testing pre breakfast and 2 hours after each meal. FBS: *** Postprandial: *** Blood glucose today in class ***  Patient instructed to monitor glucose levels:  QID FBS: 60 - <95 1 hour: <140 2 hour: <120  *Patient received handouts: Nutrition Diabetes and Pregnancy Carbohydrate Counting List Blood glucose log Snack ideas for diabetes during pregnancy Plate Planner  Patient will be seen for follow-up as needed.

## 2024-08-19 ENCOUNTER — Encounter

## 2024-08-19 DIAGNOSIS — O24419 Gestational diabetes mellitus in pregnancy, unspecified control: Secondary | ICD-10-CM
